# Patient Record
Sex: Female | Born: 1995 | Race: Black or African American | Hispanic: No | Marital: Single | Smoking: Never smoker
Health system: Southern US, Community
[De-identification: ages and names within clinical notes are randomized; demographics above are authoritative.]

## PROBLEM LIST (undated history)

## (undated) DIAGNOSIS — F32A Depression, unspecified: Secondary | ICD-10-CM

## (undated) DIAGNOSIS — F329 Major depressive disorder, single episode, unspecified: Secondary | ICD-10-CM

---
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
## 2014-02-19 ENCOUNTER — Emergency Department (INDEPENDENT_AMBULATORY_CARE_PROVIDER_SITE_OTHER)
Admission: EM | Admit: 2014-02-19 | Discharge: 2014-02-19 | Disposition: A | Discharge status: Home / Self Care | Payer: Self-pay | Source: Home / Self Care | Attending: Emergency Medicine | Admitting: Emergency Medicine

## 2014-02-19 ENCOUNTER — Encounter (HOSPITAL_COMMUNITY): Payer: Self-pay | Admitting: Emergency Medicine

## 2014-02-19 DIAGNOSIS — S39012A Strain of muscle, fascia and tendon of lower back, initial encounter: Secondary | ICD-10-CM

## 2014-02-19 DIAGNOSIS — S161XXA Strain of muscle, fascia and tendon at neck level, initial encounter: Secondary | ICD-10-CM

## 2014-02-19 DIAGNOSIS — S060X0A Concussion without loss of consciousness, initial encounter: Secondary | ICD-10-CM

## 2014-02-19 DIAGNOSIS — G4489 Other headache syndrome: Secondary | ICD-10-CM

## 2014-02-19 DIAGNOSIS — Y92410 Unspecified street and highway as the place of occurrence of the external cause: Secondary | ICD-10-CM

## 2014-02-19 HISTORY — DX: Depression, unspecified: F32.A

## 2014-02-19 HISTORY — DX: Major depressive disorder, single episode, unspecified: F32.9

## 2014-02-19 MED ORDER — METHOCARBAMOL 500 MG PO TABS
500.0000 mg | ORAL_TABLET | Freq: Three times a day (TID) | ORAL | Status: DC
Start: 2014-02-19 — End: 2016-05-17

## 2014-02-19 MED ORDER — NAPROXEN 500 MG PO TABS: 500.0000 mg | ORAL_TABLET | Freq: Two times a day (BID) | ORAL | Status: DC

## 2014-02-19 MED ORDER — HYDROCODONE-ACETAMINOPHEN 5-325 MG PO TABS: ORAL_TABLET | ORAL | Status: DC
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
## 2014-02-19 NOTE — Discharge Instructions (Signed)
TREATMENT  °Treatment initially involves the use of ice and medication to help reduce pain and inflammation. It is also important to perform strengthening and stretching exercises and modify activities that worsen symptoms so the injury does not get worse. These exercises may be performed at home or with a therapist. For patients who experience severe symptoms, a soft padded collar may be recommended to be worn around the neck.  °Improving your posture may help reduce symptoms. Posture improvement includes pulling your chin and abdomen in while sitting or standing. If you are sitting, sit in a firm chair with your buttocks against the back of the chair. While sleeping, try replacing your pillow with a small towel rolled to 2 inches in diameter, or use a cervical pillow. Poor sleeping positions delay healing.  ° °MEDICATION  °· If pain medication is necessary, nonsteroidal anti-inflammatory medications, such as aspirin and ibuprofen, or other minor pain relievers, such as acetaminophen, are often recommended. °· Do not take pain medication for 7 days before surgery. °· Prescription pain relievers may be given if deemed necessary by your caregiver. Use only as directed and only as much as you need. ° °HEAT AND COLD:  °· Cold treatment (icing) relieves pain and reduces inflammation. Cold treatment should be applied for 10 to 15 minutes every 2 to 3 hours for inflammation and pain and immediately after any activity that aggravates your symptoms. Use ice packs or an ice massage. °· Heat treatment may be used prior to performing the stretching and strengthening activities prescribed by your caregiver, physical therapist, or athletic trainer. Use a heat pack or a warm soak. ° °SEEK MEDICAL CARE IF:  °· Symptoms get worse or do not improve in 2 weeks despite treatment. °· New, unexplained symptoms develop (drugs used in treatment may produce side effects). ° °EXERCISES °RANGE OF MOTION (ROM) AND STRETCHING EXERCISES -  Cervical Strain and Sprain °These exercises may help you when beginning to rehabilitate your injury. In order to successfully resolve your symptoms, you must improve your posture. These exercises are designed to help reduce the forward-head and rounded-shoulder posture which contributes to this condition. Your symptoms may resolve with or without further involvement from your physician, physical therapist or athletic trainer. While completing these exercises, remember:  °· Restoring tissue flexibility helps normal motion to return to the joints. This allows healthier, less painful movement and activity. °· An effective stretch should be held for at least 20 seconds, although you may need to begin with shorter hold times for comfort. °· A stretch should never be painful. You should only feel a gentle lengthening or release in the stretched tissue. ° °STRETCH- Axial Extensors °· Lie on your back on the floor. You may bend your knees for comfort. Place a rolled up hand towel or dish towel, about 2 inches in diameter, under the part of your head that makes contact with the floor. °· Gently tuck your chin, as if trying to make a "double chin," until you feel a gentle stretch at the base of your head. °· Hold _____10_____ seconds. °Repeat _____10_____ times. Complete this exercise _____2_____ times per day.  ° °STRETECH - Axial Extension  °· Stand or sit on a firm surface. Assume a good posture: chest up, shoulders drawn back, abdominal muscles slightly tense, knees unlocked (if standing) and feet hip width apart. °· Slowly retract your chin so your head slides back and your chin slightly lowers.Continue to look straight ahead. °· You should feel a gentle stretch   in the back of your head. Be certain not to feel an aggressive stretch since this can cause headaches later. °· Hold for ____10______ seconds. °Repeat _____10_____ times. Complete this exercise ____2______ times per day. ° °STRETCH  Cervical Side Bend  °· Stand  or sit on a firm surface. Assume a good posture: chest up, shoulders drawn back, abdominal muscles slightly tense, knees unlocked (if standing) and feet hip width apart. °· Without letting your nose or shoulders move, slowly tip your right / left ear to your shoulder until your feel a gentle stretch in the muscles on the opposite side of your neck. °· Hold _____10_____ seconds. °Repeat _____10_____ times. Complete this exercise _____2_____ times per day. ° °STRETCH  Cervical Rotators  °· Stand or sit on a firm surface. Assume a good posture: chest up, shoulders drawn back, abdominal muscles slightly tense, knees unlocked (if standing) and feet hip width apart. °· Keeping your eyes level with the ground, slowly turn your head until you feel a gentle stretch along the back and opposite side of your neck. °· Hold _____10_____ seconds. °Repeat ____10______ times. Complete this exercise ____2______ times per day. ° °RANGE OF MOTION - Neck Circles  °· Stand or sit on a firm surface. Assume a good posture: chest up, shoulders drawn back, abdominal muscles slightly tense, knees unlocked (if standing) and feet hip width apart. °· Gently roll your head down and around from the back of one shoulder to the back of the other. The motion should never be forced or painful. °· Repeat the motion 10-20 times, or until you feel the neck muscles relax and loosen. °Repeat ____10______ times. Complete the exercise _____2_____ times per day. ° °STRENGTHENING EXERCISES - Cervical Strain and Sprain °These exercises may help you when beginning to rehabilitate your injury. They may resolve your symptoms with or without further involvement from your physician, physical therapist or athletic trainer. While completing these exercises, remember:  °· Muscles can gain both the endurance and the strength needed for everyday activities through controlled exercises. °· Complete these exercises as instructed by your physician, physical therapist or  athletic trainer. Progress the resistance and repetitions only as guided. °· You may experience muscle soreness or fatigue, but the pain or discomfort you are trying to eliminate should never worsen during these exercises. If this pain does worsen, stop and make certain you are following the directions exactly. If the pain is still present after adjustments, discontinue the exercise until you can discuss the trouble with your clinician. ° °STRENGTH Cervical Flexors, Isometric °· Face a wall, standing about 6 inches away. Place a small pillow, a ball about 6-8 inches in diameter, or a folded towel between your forehead and the wall. °· Slightly tuck your chin and gently push your forehead into the soft object. Push only with mild to moderate intensity, building up tension gradually. Keep your jaw and forehead relaxed. °· Hold 10 to 20 seconds. Keep your breathing relaxed. °· Release the tension slowly. Relax your neck muscles completely before you start the next repetition. °Repeat _____10_____ times. Complete this exercise _____2_____ times per day. ° °STRENGTH- Cervical Lateral Flexors, Isometric  °· Stand about 6 inches away from a wall. Place a small pillow, a ball about 6-8 inches in diameter, or a folded towel between the side of your head and the wall. °· Slightly tuck your chin and gently tilt your head into the soft object. Push only with mild to moderate intensity, building up tension gradually. Keep   your jaw and forehead relaxed. °· Hold 10 to 20 seconds. Keep your breathing relaxed. °· Release the tension slowly. Relax your neck muscles completely before you start the next repetition. °Repeat _____10_____ times. Complete this exercise ____2______ times per day. ° °STRENGTH  Cervical Extensors, Isometric  °· Stand about 6 inches away from a wall. Place a small pillow, a ball about 6-8 inches in diameter, or a folded towel between the back of your head and the wall. °· Slightly tuck your chin and gently  tilt your head back into the soft object. Push only with mild to moderate intensity, building up tension gradually. Keep your jaw and forehead relaxed. °· Hold 10 to 20 seconds. Keep your breathing relaxed. °· Release the tension slowly. Relax your neck muscles completely before you start the next repetition. °Repeat _____10_____ times. Complete this exercise _____2_____ times per day. ° °POSTURE AND BODY MECHANICS CONSIDERATIONS - Cervical Strain and Sprain °Keeping correct posture when sitting, standing or completing your activities will reduce the stress put on different body tissues, allowing injured tissues a chance to heal and limiting painful experiences. The following are general guidelines for improved posture. Your physician or physical therapist will provide you with any instructions specific to your needs. While reading these guidelines, remember: °· The exercises prescribed by your provider will help you have the flexibility and strength to maintain correct postures. °· The correct posture provides the optimal environment for your joints to work. All of your joints have less wear and tear when properly supported by a spine with good posture. This means you will experience a healthier, less painful body. °· Correct posture must be practiced with all of your activities, especially prolonged sitting and standing. Correct posture is as important when doing repetitive low-stress activities (typing) as it is when doing a single heavy-load activity (lifting). °PROLONGED STANDING WHILE SLIGHTLY LEANING FORWARD °When completing a task that requires you to lean forward while standing in one place for a long time, place either foot up on a stationary 2-4 inch high object to help maintain the best posture. When both feet are on the ground, the low back tends to lose its slight inward curve. If this curve flattens (or becomes too large), then the back and your other joints will experience too much stress, fatigue  more quickly and can cause pain.  °RESTING POSITIONS °Consider which positions are most painful for you when choosing a resting position. If you have pain with flexion-based activities (sitting, bending, stooping, squatting), choose a position that allows you to rest in a less flexed posture. You would want to avoid curling into a fetal position on your side. If your pain worsens with extension-based activities (prolonged standing, working overhead), avoid resting in an extended position such as sleeping on your stomach. Most people will find more comfort when they rest with their spine in a more neutral position, neither too rounded nor too arched. Lying on a non-sagging bed on your side with a pillow between your knees, or on your back with a pillow under your knees will often provide some relief. Keep in mind, being in any one position for a prolonged period of time, no matter how correct your posture, can still lead to stiffness. °WALKING °Walk with an upright posture. Your ears, shoulders and hips should all line-up. °OFFICE WORK °When working at a desk, create an environment that supports good, upright posture. Without extra support, muscles fatigue and lead to excessive strain on joints and other tissues. °  CHAIR: °· A chair should be able to slide under your desk when your back makes contact with the back of the chair. This allows you to work closely. °· The chair's height should allow your eyes to be level with the upper part of your monitor and your hands to be slightly lower than your elbows. °· Body position: °· Your feet should make contact with the floor. If this is not possible, use a foot rest. °· Keep your ears over your shoulders. This will reduce stress on your neck and low back. °Document Released: 08/12/2005 Document Revised: 11/04/2011 Document Reviewed: 11/24/2008 °[employer]® Patient Information ©2013 [employer]. ° °Do exercises twice daily followed by moist heat for 15  minutes. ° ° ° ° ° °Try to be as active as possible. ° °If no better in 2 weeks, follow up with orthopedist. ° ° °
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
## 2014-04-08 ENCOUNTER — Ambulatory Visit: Payer: Managed Care, Other (non HMO) | Attending: Orthopaedic Surgery

## 2014-04-08 DIAGNOSIS — IMO0001 Reserved for inherently not codable concepts without codable children: Secondary | ICD-10-CM | POA: Insufficient documentation

## 2014-04-08 DIAGNOSIS — M542 Cervicalgia: Secondary | ICD-10-CM | POA: Insufficient documentation

## 2014-04-08 DIAGNOSIS — M545 Low back pain, unspecified: Secondary | ICD-10-CM | POA: Insufficient documentation

## 2014-04-12 ENCOUNTER — Ambulatory Visit: Payer: Managed Care, Other (non HMO) | Admitting: Physical Therapy

## 2014-04-12 DIAGNOSIS — IMO0001 Reserved for inherently not codable concepts without codable children: Secondary | ICD-10-CM | POA: Diagnosis not present

## 2014-04-19 ENCOUNTER — Ambulatory Visit: Payer: Managed Care, Other (non HMO) | Admitting: Physical Therapy

## 2014-04-19 DIAGNOSIS — IMO0001 Reserved for inherently not codable concepts without codable children: Secondary | ICD-10-CM | POA: Diagnosis not present

## 2014-10-24 ENCOUNTER — Emergency Department (HOSPITAL_COMMUNITY)
Admission: EM | Admit: 2014-10-24 | Discharge: 2014-10-24 | Disposition: A | Discharge status: Home / Self Care | Payer: Managed Care, Other (non HMO) | Source: Home / Self Care | Attending: Emergency Medicine | Admitting: Emergency Medicine

## 2014-10-24 ENCOUNTER — Encounter (HOSPITAL_COMMUNITY): Payer: Self-pay | Admitting: Emergency Medicine

## 2014-10-24 ENCOUNTER — Other Ambulatory Visit (HOSPITAL_COMMUNITY)
Admission: RE | Admit: 2014-10-24 | Discharge: 2014-10-24 | Disposition: A | Discharge status: Home / Self Care | Payer: Managed Care, Other (non HMO) | Source: Ambulatory Visit | Attending: Emergency Medicine | Admitting: Emergency Medicine

## 2014-10-24 DIAGNOSIS — N760 Acute vaginitis: Secondary | ICD-10-CM | POA: Diagnosis present

## 2014-10-24 DIAGNOSIS — Z113 Encounter for screening for infections with a predominantly sexual mode of transmission: Secondary | ICD-10-CM | POA: Insufficient documentation

## 2014-10-24 LAB — POCT URINALYSIS DIP (DEVICE)
Bilirubin Urine: NEGATIVE
Glucose, UA: NEGATIVE mg/dL
Hgb urine dipstick: NEGATIVE
Ketones, ur: NEGATIVE mg/dL
Leukocytes, UA: NEGATIVE
NITRITE: NEGATIVE
Protein, ur: NEGATIVE mg/dL
UROBILINOGEN UA: 0.2 mg/dL (ref 0.0–1.0)
pH: 5.5 (ref 5.0–8.0)

## 2014-10-24 LAB — POCT PREGNANCY, URINE: PREG TEST UR: NEGATIVE

## 2014-10-24 MED ORDER — FLUCONAZOLE 150 MG PO TABS: 150.0000 mg | ORAL_TABLET | Freq: Every day | ORAL | Status: DC

## 2014-10-24 NOTE — ED Provider Notes (Signed)
CSN: [id number]     Arrival date & time 10/24/14  0807 History   First MD Initiated Contact with Patient 10/24/14 0825     Chief Complaint  Patient presents with  . Vaginal Discharge   (Consider location/radiation/quality/duration/timing/severity/associated sxs/prior Treatment) HPI Comments: LNMP: one week ago Sexually active Has Nexplanon Denies concerns for STIs  Patient is a 19 y.o. female presenting with vaginal discharge. The history is provided by the patient.  Vaginal Discharge Quality:  Malodorous Severity:  Mild Onset quality:  Gradual Duration:  5 days Timing:  Constant Progression:  Unchanged Chronicity:  New Associated symptoms: no dyspareunia, no dysuria and no fever     Past Medical History  Diagnosis Date  . Depression    History reviewed. No pertinent past surgical history. History reviewed. No pertinent family history. History  Substance Use Topics  . Smoking status: Never Smoker   . Smokeless tobacco: Never Used  . Alcohol Use: No   OB History    No data available     Review of Systems  Constitutional: Negative for fever.  Gastrointestinal: Negative.   Genitourinary: Positive for vaginal discharge. Negative for dysuria, urgency, frequency, hematuria, flank pain, vaginal bleeding, difficulty urinating, genital sores, vaginal pain, menstrual problem, pelvic pain and dyspareunia.  Musculoskeletal: Negative for back pain.  Skin: Negative.     Allergies  Shellfish allergy  Home Medications   Prior to Admission medications   Medication Sig Start Date End Date Taking? Authorizing Provider  cephALEXin (KEFLEX) 500 MG capsule Take 1 capsule (500 mg total) by mouth 2 (two) times daily. 11/09/13   Jillyn Ledger, PA-C  ciprofloxacin (CIPRO) 250 MG tablet Take 1 tablet (250 mg total) by mouth every 12 (twelve) hours. 12/24/13   Linna Hoff, MD  fluconazole (DIFLUCAN) 150 MG tablet Take 1 tablet (150 mg total) by mouth daily. 10/24/14   Mathis Fare  Talin Rozeboom, PA  FLUoxetine (PROZAC) 20 MG capsule Take 20 mg by mouth daily.    Historical Provider, MD  HYDROcodone-acetaminophen (NORCO/VICODIN) 5-325 MG per tablet 1 to 2 tabs every 4 to 6 hours as needed for pain. 02/19/14   Reuben Likes, MD  ibuprofen (ADVIL,MOTRIN) 200 MG tablet Take 400 mg by mouth every 6 (six) hours as needed (pain).    Historical Provider, MD  methocarbamol (ROBAXIN) 500 MG tablet Take 1 tablet (500 mg total) by mouth 3 (three) times daily. 02/19/14   Reuben Likes, MD  naproxen (NAPROSYN) 500 MG tablet Take 1 tablet (500 mg total) by mouth 2 (two) times daily. 02/19/14   Reuben Likes, MD  phenazopyridine (PYRIDIUM) 200 MG tablet Take 1 tablet (200 mg total) by mouth 3 (three) times daily. 11/09/13   Jillyn Ledger, PA-C  terbinafine (LAMISIL) 250 MG tablet Take 250 mg by mouth daily.    Historical Provider, MD   BP 115/75 mmHg  Pulse 92  Temp(Src) 99.5 F (37.5 C) (Oral)  Resp 16  SpO2 99% Physical Exam  Constitutional: She is oriented to person, place, and time. She appears well-developed and well-nourished. No distress.  HENT:  Head: Normocephalic and atraumatic.  Cardiovascular: Normal rate.   Pulmonary/Chest: Effort normal.  Genitourinary: Uterus normal. Pelvic exam was performed with patient supine. There is no rash, tenderness or lesion on the right labia. There is no rash, tenderness or lesion on the left labia. Cervix exhibits no motion tenderness, no discharge and no friability. Right adnexum displays no mass, no tenderness and no fullness. Left  adnexum displays no mass, no tenderness and no fullness. No erythema, tenderness or bleeding in the vagina. No foreign body around the vagina. No signs of injury around the vagina. Vaginal discharge found.  +thick white adherent vaginal discharge  Musculoskeletal: Normal range of motion.  Neurological: She is alert and oriented to person, place, and time.  Skin: Skin is warm and dry.  Psychiatric: She has a  normal mood and affect. Her behavior is normal.  Nursing note and vitals reviewed.   ED Course  Procedures (including critical care time) Labs Review Labs Reviewed  POCT URINALYSIS DIP (DEVICE)  POCT PREGNANCY, URINE  CERVICOVAGINAL ANCILLARY ONLY    Imaging Review No results found.   MDM   1. Vaginitis    [facility] Will treat with diflucan Will contact patient by phone if cervicovaginal swabs indicate the need for additional treatment.     Mathis FareJennifer Lee H Child[place]burgPress[place], [place] 10/24/14 [phone]

## 2014-10-24 NOTE — ED Notes (Signed)
Pt states that she has had a vaginal odor for over a week. Pt states that she believes she has a yeast infection.

## 2014-10-24 NOTE — Discharge Instructions (Signed)
You exam suggests a vaginal yeast infection. Please use medication as directed. If your swab results indicate the need for additional treatment, you will be notified by phone.  No sex until symptoms resolve.  Vaginitis Vaginitis is an inflammation of the vagina. It is most often caused by a change in the normal balance of the bacteria and yeast that live in the vagina. This change in balance causes an overgrowth of certain bacteria or yeast, which causes the inflammation. There are different types of vaginitis, but the most common types are:  Bacterial vaginosis.  Yeast infection (candidiasis).  Trichomoniasis vaginitis. This is a sexually transmitted infection (STI).  Viral vaginitis.  Atropic vaginitis.  Allergic vaginitis. CAUSES  The cause depends on the type of vaginitis. Vaginitis can be caused by:  Bacteria (bacterial vaginosis).  Yeast (yeast infection).  A parasite (trichomoniasis vaginitis)  A virus (viral vaginitis).  Low hormone levels (atrophic vaginitis). Low hormone levels can occur during pregnancy, breastfeeding, or after menopause.  Irritants, such as bubble baths, scented tampons, and feminine sprays (allergic vaginitis). Other factors can change the normal balance of the yeast and bacteria that live in the vagina. These include:  Antibiotic medicines.  Poor hygiene.  Diaphragms, vaginal sponges, spermicides, birth control pills, and intrauterine devices (IUD).  Sexual intercourse.  Infection.  Uncontrolled diabetes.  A weakened immune system. SYMPTOMS  Symptoms can vary depending on the cause of the vaginitis. Common symptoms include:  Abnormal vaginal discharge.  The discharge is white, gray, or yellow with bacterial vaginosis.  The discharge is thick, white, and cheesy with a yeast infection.  The discharge is frothy and yellow or greenish with trichomoniasis.  A bad vaginal odor.  The odor is fishy with bacterial vaginosis.  Vaginal  itching, pain, or swelling.  Painful intercourse.  Pain or burning when urinating. Sometimes, there are no symptoms. TREATMENT  Treatment will vary depending on the type of infection.   Bacterial vaginosis and trichomoniasis are often treated with antibiotic creams or pills.  Yeast infections are often treated with antifungal medicines, such as vaginal creams or suppositories.  Viral vaginitis has no cure, but symptoms can be treated with medicines that relieve discomfort. Your sexual partner should be treated as well.  Atrophic vaginitis may be treated with an estrogen cream, pill, suppository, or vaginal ring. If vaginal dryness occurs, lubricants and moisturizing creams may help. You may be told to avoid scented soaps, sprays, or douches.  Allergic vaginitis treatment involves quitting the use of the product that is causing the problem. Vaginal creams can be used to treat the symptoms. HOME CARE INSTRUCTIONS   Take all medicines as directed by your caregiver.  Keep your genital area clean and dry. Avoid soap and only rinse the area with water.  Avoid douching. It can remove the healthy bacteria in the vagina.  Do not use tampons or have sexual intercourse until your vaginitis has been treated. Use sanitary pads while you have vaginitis.  Wipe from front to back. This avoids the spread of bacteria from the rectum to the vagina.  Let air reach your genital area.  Wear cotton underwear to decrease moisture buildup.  Avoid wearing underwear while you sleep until your vaginitis is gone.  Avoid tight pants and underwear or nylons without a cotton panel.  Take off wet clothing (especially bathing suits) as soon as possible.  Use mild, non-scented products. Avoid using irritants, such as:  Scented feminine sprays.  Fabric softeners.  Scented detergents.  Scented tampons.  Scented soaps or bubble baths.  Practice safe sex and use condoms. Condoms may prevent the spread  of trichomoniasis and viral vaginitis. SEEK MEDICAL CARE IF:   You have abdominal pain.  You have a fever or persistent symptoms for more than 2-3 days.  You have a fever and your symptoms suddenly get worse. Document Released: 06/09/2007 Document Revised: 05/06/2012 Document Reviewed: 01/23/2012 [facility] Patient Information 2015 [place], [place]. This information is not intended to replace advice given to you by your health care provider. Make sure you discuss any questions you have with your health care provider.

## 2014-10-25 LAB — CERVICOVAGINAL ANCILLARY ONLY
CHLAMYDIA, DNA PROBE: NEGATIVE
Neisseria Gonorrhea: NEGATIVE
WET PREP (BD AFFIRM): NEGATIVE
Wet Prep (BD Affirm): NEGATIVE
Wet Prep (BD Affirm): NEGATIVE

## 2014-12-10 ENCOUNTER — Emergency Department (HOSPITAL_COMMUNITY)
Admission: EM | Admit: 2014-12-10 | Discharge: 2014-12-10 | Disposition: A | Discharge status: Home / Self Care | Payer: Managed Care, Other (non HMO) | Source: Home / Self Care | Attending: Family Medicine | Admitting: Family Medicine

## 2014-12-10 ENCOUNTER — Encounter (HOSPITAL_COMMUNITY): Payer: Self-pay | Admitting: *Deleted

## 2014-12-10 DIAGNOSIS — N390 Urinary tract infection, site not specified: Secondary | ICD-10-CM

## 2014-12-10 LAB — POCT URINALYSIS DIP (DEVICE)
Bilirubin Urine: NEGATIVE
GLUCOSE, UA: 250 mg/dL — AB
Hgb urine dipstick: NEGATIVE
Ketones, ur: NEGATIVE mg/dL
Nitrite: POSITIVE — AB
Protein, ur: 30 mg/dL — AB
SPECIFIC GRAVITY, URINE: 1.02 (ref 1.005–1.030)
Urobilinogen, UA: 2 mg/dL — ABNORMAL HIGH (ref 0.0–1.0)
pH: 5.5 (ref 5.0–8.0)

## 2014-12-10 LAB — POCT PREGNANCY, URINE: Preg Test, Ur: NEGATIVE

## 2014-12-10 MED ORDER — CEPHALEXIN 500 MG PO CAPS: 500.0000 mg | ORAL_CAPSULE | Freq: Four times a day (QID) | ORAL | Status: DC

## 2014-12-10 NOTE — ED Provider Notes (Signed)
CSN: [id number]     Arrival date & time 12/10/14  0900 History   First MD Initiated Contact with Patient 12/10/14 0915     Chief Complaint  Patient presents with  . Urinary Tract Infection   (Consider location/radiation/quality/duration/timing/severity/associated sxs/prior Treatment) Patient is a 19 y.o. female presenting with urinary tract infection. The history is provided by the patient.  Urinary Tract Infection This is a new problem. The current episode started more than 2 days ago. The problem has not changed since onset.Pertinent negatives include no chest pain and no abdominal pain.    Past Medical History  Diagnosis Date  . Depression    History reviewed. No pertinent past surgical history. History reviewed. No pertinent family history. History  Substance Use Topics  . Smoking status: Never Smoker   . Smokeless tobacco: Never Used  . Alcohol Use: No   OB History    No data available     Review of Systems  Constitutional: Negative.   Cardiovascular: Negative for chest pain.  Gastrointestinal: Negative.  Negative for abdominal pain.  Genitourinary: Positive for dysuria, urgency and frequency. Negative for flank pain and vaginal discharge.  Musculoskeletal: Negative.     Allergies  Shellfish allergy  Home Medications   Prior to Admission medications   Medication Sig Start Date End Date Taking? Authorizing Provider  cephALEXin (KEFLEX) 500 MG capsule Take 1 capsule (500 mg total) by mouth 4 (four) times daily. Take all of medicine and drink lots of fluids 12/10/14   Linna Hoff, MD  ciprofloxacin (CIPRO) 250 MG tablet Take 1 tablet (250 mg total) by mouth every 12 (twelve) hours. 12/24/13   Linna Hoff, MD  fluconazole (DIFLUCAN) 150 MG tablet Take 1 tablet (150 mg total) by mouth daily. 10/24/14   Mathis Fare Presson, PA  FLUoxetine (PROZAC) 20 MG capsule Take 20 mg by mouth daily.    Historical Provider, MD  HYDROcodone-acetaminophen (NORCO/VICODIN) 5-325 MG  per tablet 1 to 2 tabs every 4 to 6 hours as needed for pain. 02/19/14   Reuben Likes, MD  ibuprofen (ADVIL,MOTRIN) 200 MG tablet Take 400 mg by mouth every 6 (six) hours as needed (pain).    Historical Provider, MD  methocarbamol (ROBAXIN) 500 MG tablet Take 1 tablet (500 mg total) by mouth 3 (three) times daily. 02/19/14   Reuben Likes, MD  naproxen (NAPROSYN) 500 MG tablet Take 1 tablet (500 mg total) by mouth 2 (two) times daily. 02/19/14   Reuben Likes, MD  phenazopyridine (PYRIDIUM) 200 MG tablet Take 1 tablet (200 mg total) by mouth 3 (three) times daily. 11/09/13   Jillyn Ledger, PA-C  terbinafine (LAMISIL) 250 MG tablet Take 250 mg by mouth daily.    Historical Provider, MD   BP 124/80 mmHg  Pulse 78  Temp(Src) 98.6 F (37 C) (Oral)  Resp 18  SpO2 100%  LMP 12/10/2014 Physical Exam  Constitutional: She is oriented to person, place, and time. She appears well-developed and well-nourished.  Cardiovascular: Normal heart sounds.   Pulmonary/Chest: Effort normal and breath sounds normal.  Abdominal: Soft. Bowel sounds are normal.  Neurological: She is alert and oriented to person, place, and time.  Skin: Skin is warm and dry.  Nursing note and vitals reviewed.   ED Course  Procedures (including critical care time) Labs Review Labs Reviewed  POCT URINALYSIS DIP (DEVICE) - Abnormal; Notable for the following:    Glucose, UA 250 (*)    Protein, ur 30 (*)  Urobilinogen, UA 2.0 (*)    Nitrite POSITIVE (*)    Leukocytes, UA SMALL (*)    All other components within normal limits  POCT PREGNANCY, URINE    Imaging Review No results found.   MDM   1. UTI (lower urinary tract infection)        Linna HoffJames D Lucilia Yanni, MD 12/10/14 [phone]

## 2014-12-10 NOTE — ED Notes (Signed)
Pt  Reports  Symptoms   Of  Urinary  Tract    Infection  With  Frequency  Of  Urination  /   Burning       Symptoms  X  5  Days  -  Taking      Azo

## 2014-12-10 NOTE — Discharge Instructions (Signed)
Take all of medicine as directed, drink lots of fluids, see your doctor if further problems. °

## 2016-05-17 ENCOUNTER — Encounter: Payer: Self-pay | Admitting: Podiatry

## 2016-05-17 ENCOUNTER — Ambulatory Visit (INDEPENDENT_AMBULATORY_CARE_PROVIDER_SITE_OTHER): Payer: 59 | Admitting: Podiatry

## 2016-05-17 DIAGNOSIS — B351 Tinea unguium: Secondary | ICD-10-CM

## 2016-05-17 MED ORDER — TERBINAFINE HCL 250 MG PO TABS: 250.0000 mg | ORAL_TABLET | Freq: Every day | ORAL | 0 refills | Status: AC

## 2016-05-18 LAB — HEPATIC FUNCTION PANEL
ALK PHOS: 58 U/L (ref 33–115)
ALT: 12 U/L (ref 6–29)
AST: 19 U/L (ref 10–30)
Albumin: 4.1 g/dL (ref 3.6–5.1)
BILIRUBIN DIRECT: 0.1 mg/dL (ref <=0.2)
BILIRUBIN INDIRECT: 0.2 mg/dL (ref 0.2–1.2)
TOTAL PROTEIN: 7.6 g/dL (ref 6.1–8.1)
Total Bilirubin: 0.3 mg/dL (ref 0.2–1.2)

## 2016-05-20 NOTE — Progress Notes (Signed)
Subjective:     Patient ID: Erin Valdez, female   DOB: 05/08/1996, 20 y.o.   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 Patient at this time was given a prescription for Lamisil 250 mg daily for 90 days and was given a requisition form to have liver function studies accomplished

## 2016-12-16 ENCOUNTER — Ambulatory Visit (INDEPENDENT_AMBULATORY_CARE_PROVIDER_SITE_OTHER): Payer: 59 | Admitting: Podiatry

## 2016-12-16 DIAGNOSIS — B351 Tinea unguium: Secondary | ICD-10-CM | POA: Diagnosis not present

## 2016-12-16 MED ORDER — TERBINAFINE HCL 250 MG PO TABS: ORAL_TABLET | ORAL | 0 refills | Status: DC

## 2016-12-16 MED ORDER — TERBINAFINE HCL 250 MG PO TABS: 250.0000 mg | ORAL_TABLET | Freq: Every day | ORAL | 0 refills | Status: AC

## 2016-12-16 NOTE — Progress Notes (Signed)
Subjective:    Patient ID: Erin Valdez, female   DOB: 21 y.o.   MRN: [id number]   HPI patient presents with nail disease which is improving but she still concerned about discoloration especially on the left nails    ROS      Objective:  Physical Exam Neurovascular status intact with patient noted to have some distal yellow numbness of the nailbeds left over right that's crusted and not painful but is present    Assessment:     Mycotic nail infection which is improved but present    Plan:     H&P condition reviewed and we'll start a pulse Lamisil therapy and begin topical formulas 3 and a healthier nail polish. I explained that all nail discoloration may not go away but we are getting a reduce the amount of fungal medication she is taking
